# Patient Record
Sex: Female | Born: 1980 | Race: Black or African American | Hispanic: No | Marital: Single | Smoking: Current some day smoker
Health system: Southern US, Community
[De-identification: ages and names within clinical notes are randomized; demographics above are authoritative.]

## PROBLEM LIST (undated history)

## (undated) DIAGNOSIS — W3400XA Accidental discharge from unspecified firearms or gun, initial encounter: Secondary | ICD-10-CM

---

## 2019-06-05 ENCOUNTER — Emergency Department (HOSPITAL_BASED_OUTPATIENT_CLINIC_OR_DEPARTMENT_OTHER): Payer: BC Managed Care – PPO

## 2019-06-05 ENCOUNTER — Encounter (HOSPITAL_BASED_OUTPATIENT_CLINIC_OR_DEPARTMENT_OTHER): Payer: Self-pay

## 2019-06-05 ENCOUNTER — Emergency Department (HOSPITAL_BASED_OUTPATIENT_CLINIC_OR_DEPARTMENT_OTHER)
Admission: EM | Admit: 2019-06-05 | Discharge: 2019-06-05 | Disposition: A | Discharge status: Home / Self Care | Payer: BC Managed Care – PPO | Attending: Emergency Medicine | Admitting: Emergency Medicine

## 2019-06-05 ENCOUNTER — Other Ambulatory Visit: Payer: Self-pay

## 2019-06-05 DIAGNOSIS — F17210 Nicotine dependence, cigarettes, uncomplicated: Secondary | ICD-10-CM | POA: Diagnosis not present

## 2019-06-05 DIAGNOSIS — Z79899 Other long term (current) drug therapy: Secondary | ICD-10-CM | POA: Insufficient documentation

## 2019-06-05 DIAGNOSIS — Z20822 Contact with and (suspected) exposure to covid-19: Secondary | ICD-10-CM | POA: Insufficient documentation

## 2019-06-05 DIAGNOSIS — R0789 Other chest pain: Secondary | ICD-10-CM | POA: Diagnosis not present

## 2019-06-05 DIAGNOSIS — R079 Chest pain, unspecified: Secondary | ICD-10-CM | POA: Diagnosis present

## 2019-06-05 HISTORY — DX: Accidental discharge from unspecified firearms or gun, initial encounter: W34.00XA

## 2019-06-05 LAB — BASIC METABOLIC PANEL
Anion gap: 5 (ref 5–15)
BUN: 16 mg/dL (ref 6–20)
CO2: 24 mmol/L (ref 22–32)
Calcium: 9.1 mg/dL (ref 8.9–10.3)
Chloride: 107 mmol/L (ref 98–111)
Creatinine, Ser: 0.79 mg/dL (ref 0.44–1.00)
GFR calc Af Amer: >60 mL/min (ref >60)
GFR calc non Af Amer: >60 mL/min (ref >60)
Glucose, Bld: 93 mg/dL (ref 70–99)
Potassium: 4.6 mmol/L (ref 3.5–5.1)
Sodium: 136 mmol/L (ref 135–145)

## 2019-06-05 LAB — CBC WITH DIFFERENTIAL/PLATELET
Abs Immature Granulocytes: 0.01 10*3/uL (ref 0.00–0.07)
Basophils Absolute: 0 10*3/uL (ref 0.0–0.1)
Basophils Relative: 1 %
Eosinophils Absolute: 0.1 10*3/uL (ref 0.0–0.5)
Eosinophils Relative: 1 %
HCT: 40.3 % (ref 36.0–46.0)
Hemoglobin: 13.8 g/dL (ref 12.0–15.0)
Immature Granulocytes: 0 %
Lymphocytes Relative: 41 %
Lymphs Abs: 2.2 10*3/uL (ref 0.7–4.0)
MCH: 31.9 pg (ref 26.0–34.0)
MCHC: 34.2 g/dL (ref 30.0–36.0)
MCV: 93.3 fL (ref 80.0–100.0)
Monocytes Absolute: 0.4 10*3/uL (ref 0.1–1.0)
Monocytes Relative: 7 %
Neutro Abs: 2.6 10*3/uL (ref 1.7–7.7)
Neutrophils Relative %: 50 %
Platelets: 289 10*3/uL (ref 150–400)
RBC: 4.32 MIL/uL (ref 3.87–5.11)
RDW: 11.6 % (ref 11.5–15.5)
WBC: 5.3 10*3/uL (ref 4.0–10.5)
nRBC: 0 % (ref 0.0–0.2)

## 2019-06-05 LAB — TROPONIN I (HIGH SENSITIVITY)
Troponin I (High Sensitivity): <2 ng/L (ref <18)
Troponin I (High Sensitivity): <2 ng/L (ref <18)

## 2019-06-05 LAB — PREGNANCY, URINE: Preg Test, Ur: NEGATIVE

## 2019-06-05 MED ORDER — METHOCARBAMOL 500 MG PO TABS: 500.0000 mg | ORAL_TABLET | Freq: Two times a day (BID) | ORAL | 0 refills | Status: DC

## 2019-06-05 MED ORDER — IBUPROFEN 400 MG PO TABS
600.0000 mg | ORAL_TABLET | Freq: Once | ORAL | Status: AC
  Administered 2019-06-05: 600 mg via ORAL
  Filled 2019-06-05: qty 1

## 2019-06-05 NOTE — ED Triage Notes (Signed)
Pt arrives to ED POV with reports of sudden onset of chest pressure this morning. Pt reports centralized CP with no radiation.

## 2019-06-05 NOTE — ED Notes (Signed)
Pt aware we need urine specimen.  

## 2019-06-05 NOTE — ED Notes (Signed)
ED Provider at bedside. 
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
## 2019-06-05 NOTE — ED Notes (Signed)
Patient transported to X-ray 

## 2019-06-05 NOTE — Discharge Instructions (Signed)
Your work-up today was reassuring. Antiinflammatory medications: Take 600 mg of ibuprofen every 6 hours or 440 mg (over the counter dose) to 500 mg (prescription dose) of naproxen every 12 hours for the next 3 days. After this time, these medications may be used as needed for pain. Take these medications with food to avoid upset stomach. Choose only one of these medications, do not take them together. Acetaminophen (generic for Tylenol): Should you continue to have additional pain while taking the ibuprofen or naproxen, you may add in acetaminophen as needed. Your daily total maximum amount of acetaminophen from all sources should be limited to 4000mg /day for persons without liver problems, or 2000mg /day for those with liver problems. Methocarbamol: Methocarbamol (generic for Robaxin) is a muscle relaxer and can help relieve stiff muscles or muscle spasms.  Do not drive or perform other dangerous activities while taking this medication as it can cause drowsiness as well as changes in reaction time and judgement. Follow-up: Follow-up with a primary care provider should symptoms continue. Return: Return to the emergency department for worsening pain, dizziness, passing out, shortness of breath, or any other major concerns.

## 2019-06-05 NOTE — ED Notes (Signed)
Pt getting dressed.

## 2019-06-07 LAB — NOVEL CORONAVIRUS, NAA (HOSP ORDER, SEND-OUT TO REF LAB; TAT 18-24 HRS): SARS-CoV-2, NAA: NOT DETECTED

## 2020-05-30 ENCOUNTER — Emergency Department (HOSPITAL_BASED_OUTPATIENT_CLINIC_OR_DEPARTMENT_OTHER): Payer: 59

## 2020-05-30 ENCOUNTER — Other Ambulatory Visit: Payer: Self-pay

## 2020-05-30 ENCOUNTER — Encounter (HOSPITAL_BASED_OUTPATIENT_CLINIC_OR_DEPARTMENT_OTHER): Payer: Self-pay | Admitting: Emergency Medicine

## 2020-05-30 ENCOUNTER — Emergency Department (HOSPITAL_BASED_OUTPATIENT_CLINIC_OR_DEPARTMENT_OTHER)
Admission: EM | Admit: 2020-05-30 | Discharge: 2020-05-30 | Disposition: A | Discharge status: Home / Self Care | Payer: 59 | Attending: Emergency Medicine | Admitting: Emergency Medicine

## 2020-05-30 DIAGNOSIS — R059 Cough, unspecified: Secondary | ICD-10-CM | POA: Diagnosis present

## 2020-05-30 DIAGNOSIS — U071 COVID-19: Secondary | ICD-10-CM | POA: Diagnosis not present

## 2020-05-30 DIAGNOSIS — F17200 Nicotine dependence, unspecified, uncomplicated: Secondary | ICD-10-CM | POA: Insufficient documentation

## 2020-05-30 DIAGNOSIS — R079 Chest pain, unspecified: Secondary | ICD-10-CM

## 2020-05-30 LAB — TROPONIN I (HIGH SENSITIVITY): Troponin I (High Sensitivity): <2 ng/L (ref <18)

## 2020-05-30 NOTE — ED Notes (Signed)
Pt on monitor and vitals cycling 
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
## 2020-05-30 NOTE — ED Triage Notes (Signed)
Reports being diagnosed with covid on Sunday.  C/o cough, body aches, fatigue, and chest pressure.

## 2020-05-30 NOTE — ED Notes (Signed)
She is very relieved to hear she has a normal CXR. She is ambulatory without difficulty.

## 2020-09-23 ENCOUNTER — Other Ambulatory Visit: Payer: Self-pay

## 2020-09-23 ENCOUNTER — Emergency Department (HOSPITAL_BASED_OUTPATIENT_CLINIC_OR_DEPARTMENT_OTHER): Payer: 59

## 2020-09-23 ENCOUNTER — Emergency Department (HOSPITAL_BASED_OUTPATIENT_CLINIC_OR_DEPARTMENT_OTHER)
Admission: EM | Admit: 2020-09-23 | Discharge: 2020-09-24 | Disposition: A | Discharge status: Home / Self Care | Payer: 59 | Attending: Emergency Medicine | Admitting: Emergency Medicine

## 2020-09-23 DIAGNOSIS — J3489 Other specified disorders of nose and nasal sinuses: Secondary | ICD-10-CM | POA: Diagnosis not present

## 2020-09-23 DIAGNOSIS — R070 Pain in throat: Secondary | ICD-10-CM | POA: Insufficient documentation

## 2020-09-23 DIAGNOSIS — R059 Cough, unspecified: Secondary | ICD-10-CM | POA: Insufficient documentation

## 2020-09-23 DIAGNOSIS — F17200 Nicotine dependence, unspecified, uncomplicated: Secondary | ICD-10-CM | POA: Insufficient documentation

## 2020-09-23 DIAGNOSIS — R0981 Nasal congestion: Secondary | ICD-10-CM | POA: Diagnosis not present

## 2020-09-23 DIAGNOSIS — R062 Wheezing: Secondary | ICD-10-CM | POA: Insufficient documentation

## 2020-09-23 DIAGNOSIS — J392 Other diseases of pharynx: Secondary | ICD-10-CM

## 2020-09-23 MED ORDER — LIDOCAINE VISCOUS HCL 2 % MT SOLN: 2.0000 mL | OROMUCOSAL | 0 refills | Status: AC | PRN

## 2020-09-23 MED ORDER — LIDOCAINE VISCOUS HCL 2 % MT SOLN
15.0000 mL | Freq: Once | OROMUCOSAL | Status: AC
  Administered 2020-09-23: 15 mL via OROMUCOSAL
  Filled 2020-09-23: qty 15

## 2020-09-23 MED ORDER — FAMOTIDINE 20 MG PO TABS: 20.0000 mg | ORAL_TABLET | Freq: Every day | ORAL | 0 refills | Status: AC

## 2020-09-23 MED ORDER — ALBUTEROL SULFATE HFA 108 (90 BASE) MCG/ACT IN AERS
4.0000 | INHALATION_SPRAY | Freq: Once | RESPIRATORY_TRACT | Status: AC
  Administered 2020-09-23: 4 via RESPIRATORY_TRACT
  Filled 2020-09-23: qty 6.7

## 2020-09-23 NOTE — ED Triage Notes (Signed)
C/o sore throat, nasal drainage, mucous congestion, cough w/ green phlegm, headache, & body aches x2 months. Completed abx & steroids rx from PMD

## 2020-09-23 NOTE — Discharge Instructions (Signed)
You can use viscous lidocaine as directed.  As we discussed, may be beneficial to take Benadryl and Pepcid to see if that can help.  Please follow-up with your referred ear nose and throat doctor.  Return to the emergency department for any vomiting, inability swallow, difficulty breathing or any other worsening concerning symptoms.
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
## 2022-07-02 IMAGING — CR DG CHEST 2V
2 series · 2 of 2 positions shown · non-contrast
Comparison: 05/30/2020

CLINICAL DATA: Cough and sore throat

EXAM:
CHEST - 2 VIEW

[w chest pa]
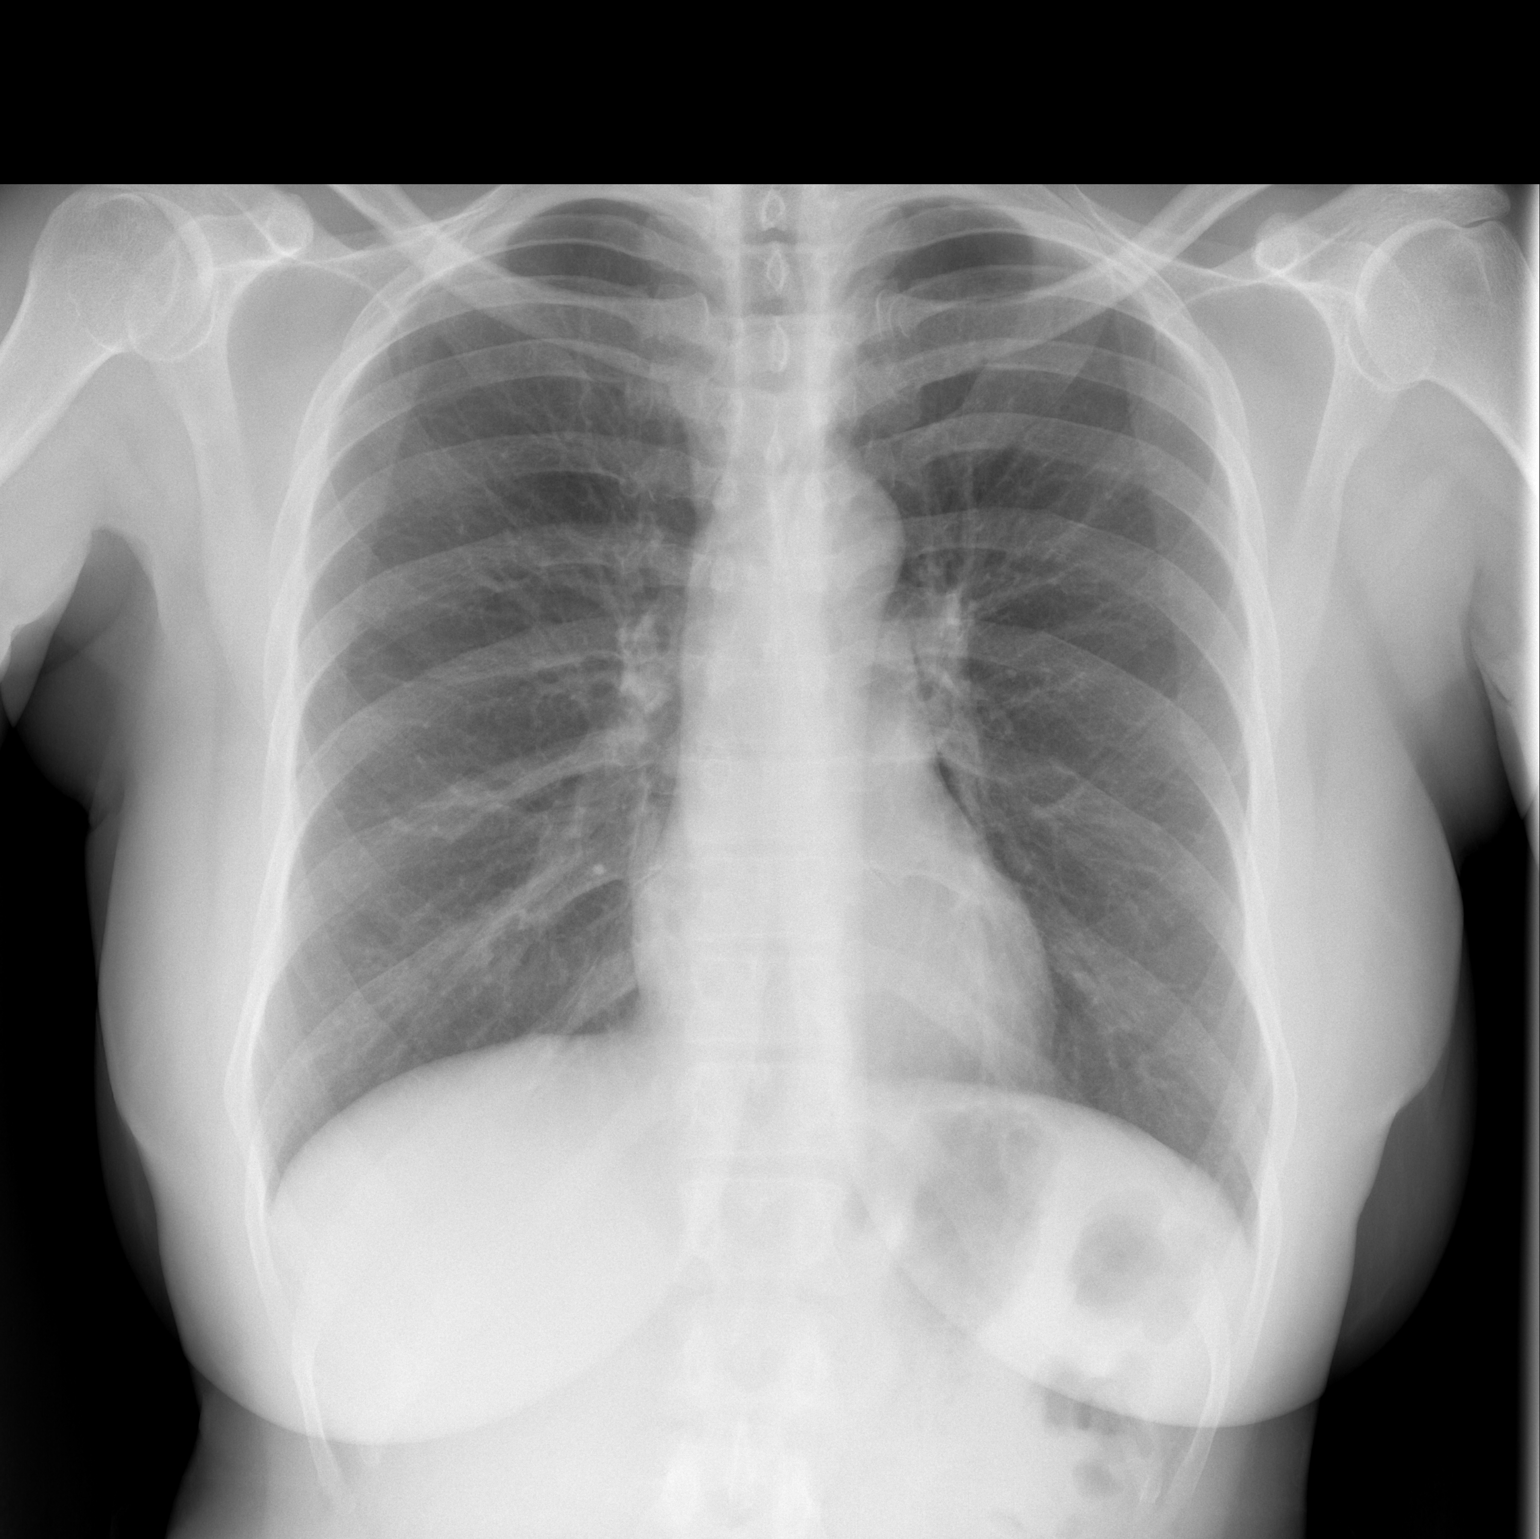

[w chest lat]
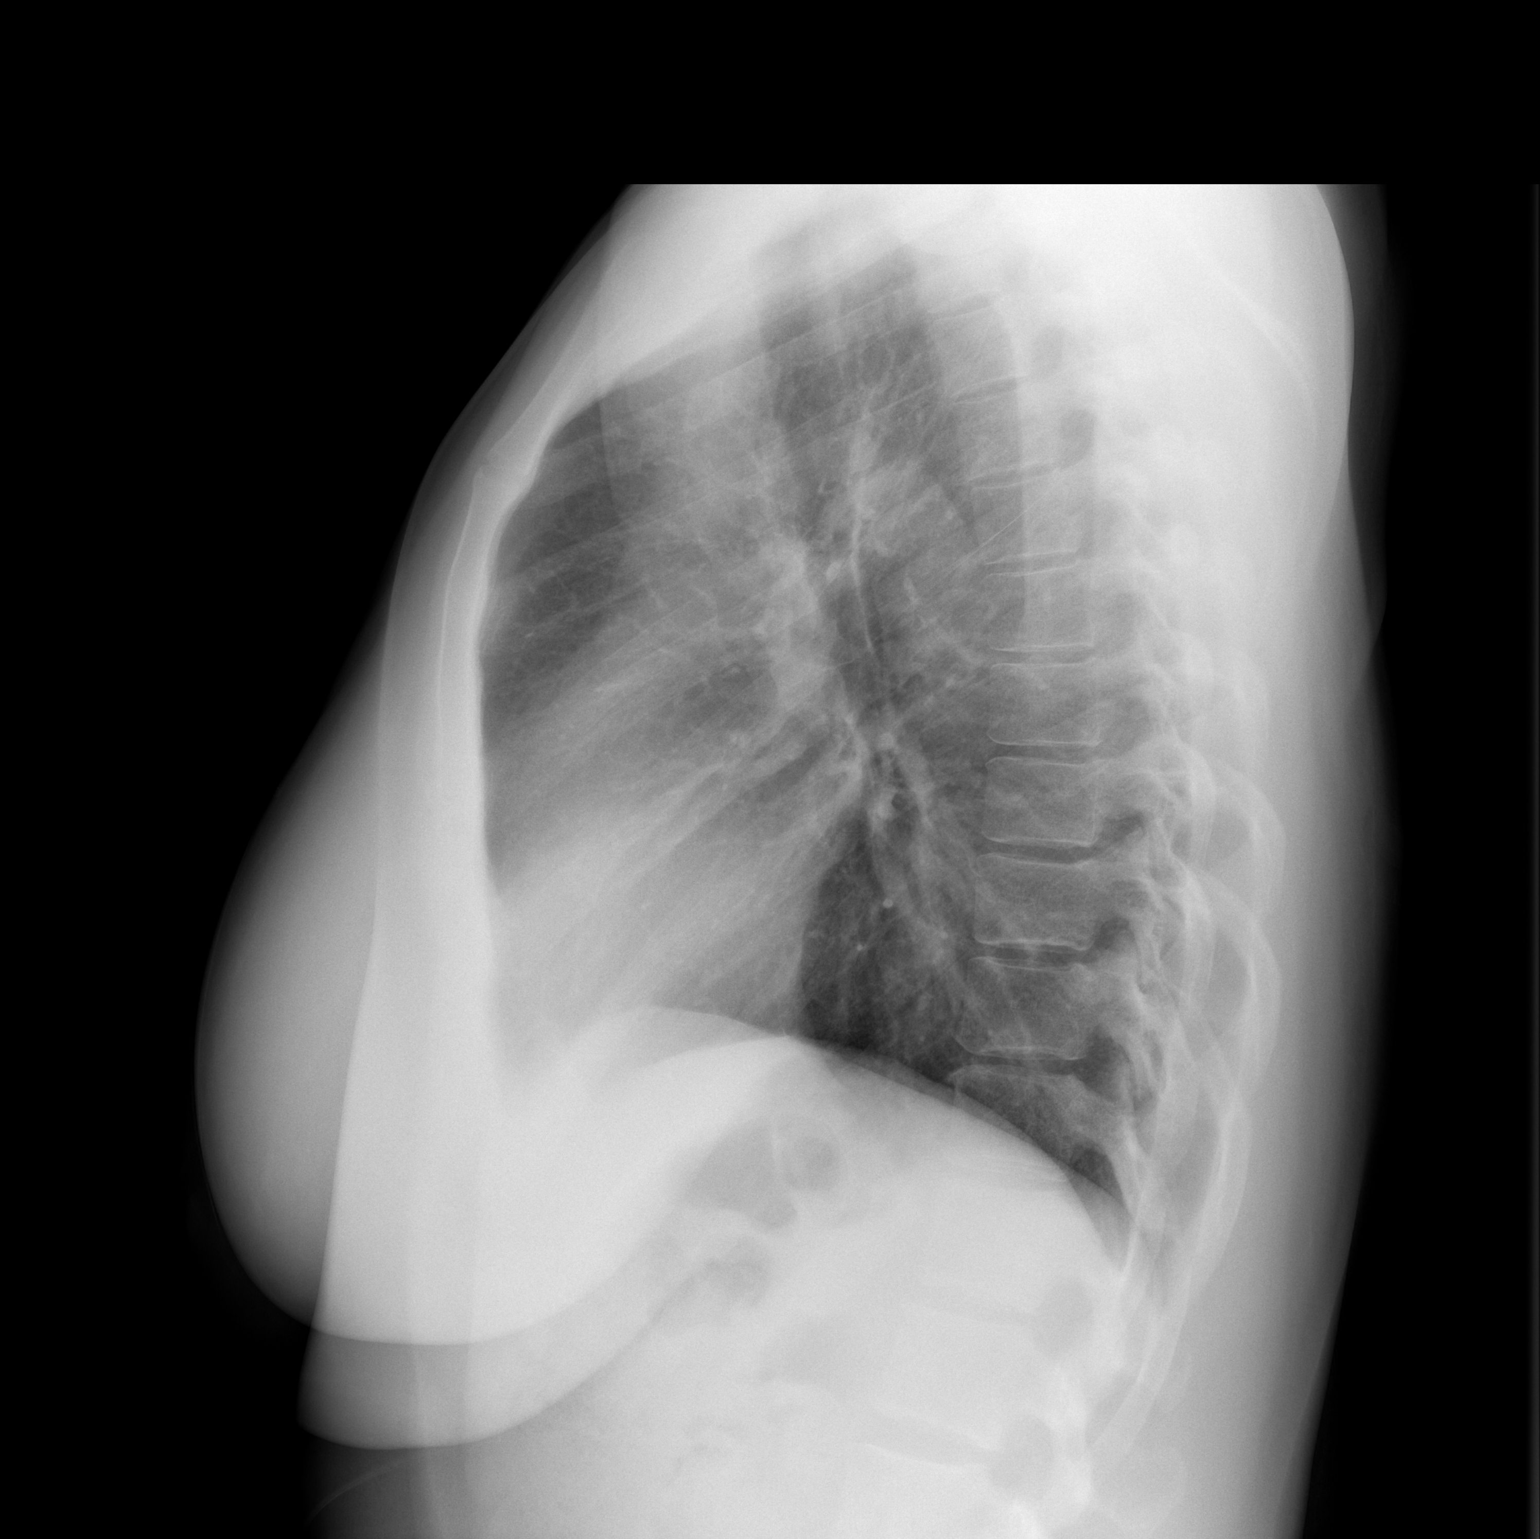

[2 of 2 positions shown; findings below may reference images not displayed]

FINDINGS: The heart size and mediastinal contours are within normal limits.
Both lungs are clear. The visualized skeletal structures are
unremarkable.
IMPRESSION: No active cardiopulmonary disease.
